# Patient Record
Sex: Female | Born: 1990 | Race: Black or African American | Hispanic: No | Marital: Single
Health system: Southern US, Community
[De-identification: ages and names within clinical notes are randomized; demographics above are authoritative.]

---

## 2020-05-23 ENCOUNTER — Emergency Department (HOSPITAL_COMMUNITY)
Admission: EM | Admit: 2020-05-23 | Discharge: 2020-05-23 | Disposition: A | Discharge status: Home / Self Care | Payer: No Typology Code available for payment source | Attending: Emergency Medicine | Admitting: Emergency Medicine

## 2020-05-23 ENCOUNTER — Emergency Department (HOSPITAL_COMMUNITY): Payer: No Typology Code available for payment source

## 2020-05-23 ENCOUNTER — Encounter (HOSPITAL_COMMUNITY): Payer: Self-pay | Admitting: Emergency Medicine

## 2020-05-23 ENCOUNTER — Other Ambulatory Visit: Payer: Self-pay

## 2020-05-23 DIAGNOSIS — R079 Chest pain, unspecified: Secondary | ICD-10-CM | POA: Diagnosis not present

## 2020-05-23 DIAGNOSIS — S8002XA Contusion of left knee, initial encounter: Secondary | ICD-10-CM | POA: Insufficient documentation

## 2020-05-23 DIAGNOSIS — R52 Pain, unspecified: Secondary | ICD-10-CM

## 2020-05-23 DIAGNOSIS — Y9389 Activity, other specified: Secondary | ICD-10-CM | POA: Diagnosis not present

## 2020-05-23 DIAGNOSIS — Y999 Unspecified external cause status: Secondary | ICD-10-CM | POA: Insufficient documentation

## 2020-05-23 DIAGNOSIS — S060X0A Concussion without loss of consciousness, initial encounter: Secondary | ICD-10-CM | POA: Diagnosis not present

## 2020-05-23 DIAGNOSIS — S0990XA Unspecified injury of head, initial encounter: Secondary | ICD-10-CM | POA: Diagnosis present

## 2020-05-23 DIAGNOSIS — Y92410 Unspecified street and highway as the place of occurrence of the external cause: Secondary | ICD-10-CM | POA: Insufficient documentation

## 2020-05-23 MED ORDER — IBUPROFEN 200 MG PO TABS
400.0000 mg | ORAL_TABLET | Freq: Once | ORAL | Status: AC
  Administered 2020-05-23: 400 mg via ORAL
  Filled 2020-05-23: qty 2

## 2020-05-23 MED ORDER — ACETAMINOPHEN 500 MG PO TABS
1000.0000 mg | ORAL_TABLET | Freq: Once | ORAL | Status: AC
  Administered 2020-05-23: 1000 mg via ORAL
  Filled 2020-05-23: qty 2

## 2020-05-23 MED ORDER — ONDANSETRON HCL 4 MG PO TABS: 4.0000 mg | ORAL_TABLET | Freq: Four times a day (QID) | ORAL | 0 refills | Status: AC

## 2020-05-23 MED ORDER — ONDANSETRON 4 MG PO TBDP
4.0000 mg | ORAL_TABLET | Freq: Once | ORAL | Status: AC
  Administered 2020-05-23: 4 mg via ORAL
  Filled 2020-05-23: qty 1

## 2020-05-23 MED ORDER — CYCLOBENZAPRINE HCL 10 MG PO TABS
10.0000 mg | ORAL_TABLET | Freq: Two times a day (BID) | ORAL | 0 refills | Status: AC | PRN
Start: 2020-05-23 — End: ?
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
